# Patient Record
Sex: Female | Born: 1945 | Race: Black or African American | Hispanic: No | Smoking: Never smoker
Health system: Southern US, Community
[De-identification: ages and names within clinical notes are randomized; demographics above are authoritative.]

## PROBLEM LIST (undated history)

## (undated) DIAGNOSIS — I10 Essential (primary) hypertension: Secondary | ICD-10-CM

---

## 2018-04-11 ENCOUNTER — Encounter (HOSPITAL_COMMUNITY): Payer: Self-pay

## 2018-04-11 ENCOUNTER — Other Ambulatory Visit: Payer: Self-pay

## 2018-04-11 ENCOUNTER — Emergency Department (HOSPITAL_COMMUNITY): Payer: Medicare (Managed Care)

## 2018-04-11 ENCOUNTER — Emergency Department (HOSPITAL_COMMUNITY)
Admission: EM | Admit: 2018-04-11 | Discharge: 2018-04-11 | Disposition: A | Discharge status: Home / Self Care | Payer: Medicare (Managed Care) | Attending: Emergency Medicine | Admitting: Emergency Medicine

## 2018-04-11 DIAGNOSIS — R079 Chest pain, unspecified: Secondary | ICD-10-CM | POA: Insufficient documentation

## 2018-04-11 DIAGNOSIS — Z5321 Procedure and treatment not carried out due to patient leaving prior to being seen by health care provider: Secondary | ICD-10-CM | POA: Insufficient documentation

## 2018-04-11 HISTORY — DX: Essential (primary) hypertension: I10

## 2018-04-11 LAB — BASIC METABOLIC PANEL
ANION GAP: 10 (ref 5–15)
BUN: 11 mg/dL (ref 6–20)
CO2: 29 mmol/L (ref 22–32)
Calcium: 10.1 mg/dL (ref 8.9–10.3)
Chloride: 102 mmol/L (ref 101–111)
Creatinine, Ser: 0.67 mg/dL (ref 0.44–1.00)
GFR calc Af Amer: >60 mL/min (ref >60)
GLUCOSE: 131 mg/dL — AB (ref 65–99)
Potassium: 3.2 mmol/L — ABNORMAL LOW (ref 3.5–5.1)
SODIUM: 141 mmol/L (ref 135–145)

## 2018-04-11 LAB — CBC
HCT: 39.5 % (ref 36.0–46.0)
HEMOGLOBIN: 12.7 g/dL (ref 12.0–15.0)
MCH: 28.4 pg (ref 26.0–34.0)
MCHC: 32.2 g/dL (ref 30.0–36.0)
MCV: 88.4 fL (ref 78.0–100.0)
Platelets: 281 10*3/uL (ref 150–400)
RBC: 4.47 MIL/uL (ref 3.87–5.11)
RDW: 13.9 % (ref 11.5–15.5)
WBC: 11.3 10*3/uL — ABNORMAL HIGH (ref 4.0–10.5)

## 2018-04-11 LAB — I-STAT TROPONIN, ED: Troponin i, poc: 0 ng/mL (ref 0.00–0.08)

## 2018-04-11 NOTE — ED Notes (Signed)
Pt's son to desk to inquire about wait times. Explained triage process and apologized for wait time.  Pt's son was very polite and stated they would wait a little longer.

## 2018-04-11 NOTE — ED Triage Notes (Signed)
Pt states she is having central CP that started today denies NV, denies radiation, denies back pain. States she has felt weak.

## 2018-04-11 NOTE — ED Notes (Signed)
Pt's son to desk inquiring as to wait time.  Reassured his mother was still on the list to be seen at re iterated triage process.  Pt's son states they may elect to go to their primary physician tomorrow. Told him that while we'd like for them to wait that is their decision.

## 2018-04-11 NOTE — ED Notes (Signed)
Pt and family have made the decision to LWBS. Pt strongly encouraged to follow up with PCP in morning.

## 2018-04-12 NOTE — ED Notes (Signed)
Follow up call made  Pain is gone  Going to follow up w pcp today   0910  04/12/18 s Javiana Anwar rn

## 2018-11-12 IMAGING — CR DG CHEST 2V
2 series · 2 of 2 positions shown · non-contrast
Comparison: None.

CLINICAL DATA: 72-year-old female with central chest pain onset
today. Weakness.

EXAM:
CHEST - 2 VIEW

[chest pa]
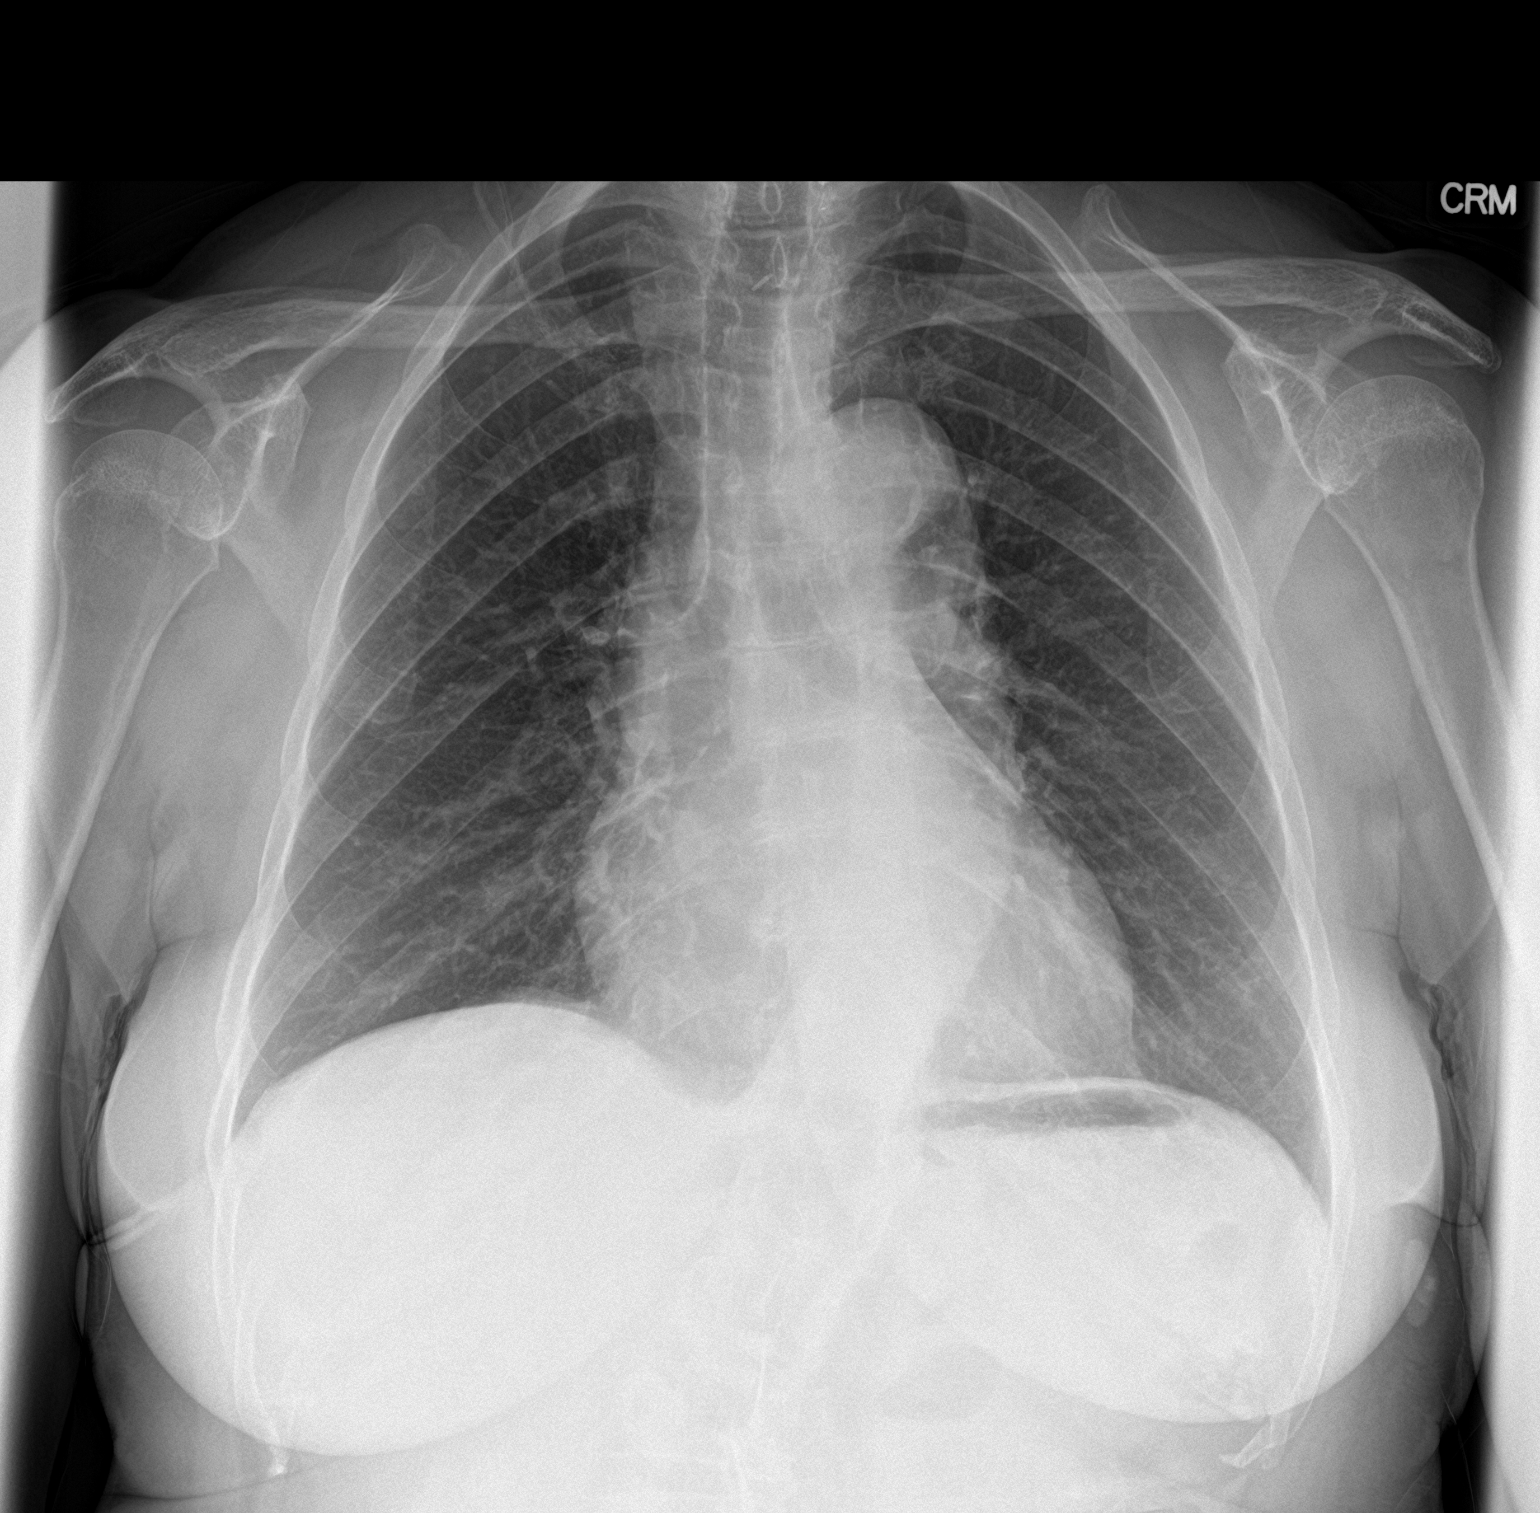

[chest lat]
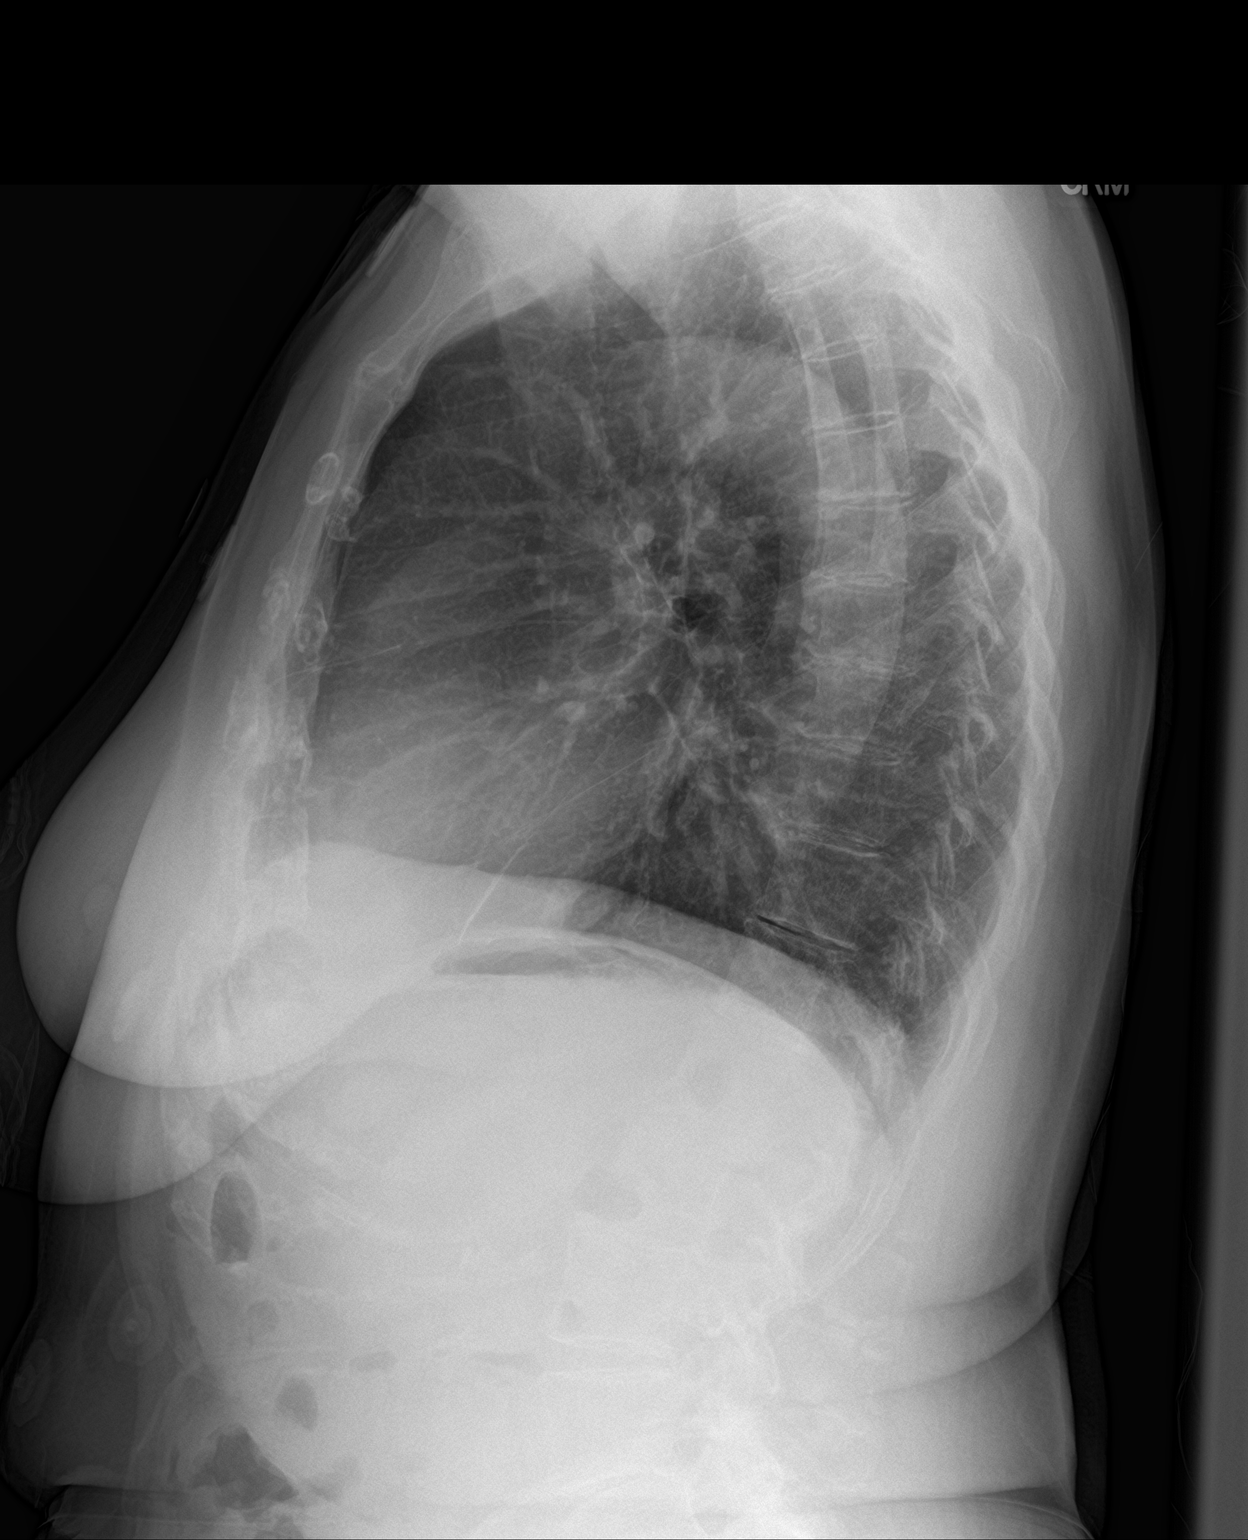

[2 of 2 positions shown; findings below may reference images not displayed]

FINDINGS: Mildly tortuous thoracic aorta. Other mediastinal contours are
normal. Visualized tracheal air column is within normal limits.
Small thoracic inlet surgical clips might indicate prior
thyroidectomy. No pneumothorax, pulmonary edema, pleural effusion or
confluent pulmonary opacity. Thoracolumbar scoliosis. No acute
osseous abnormality identified. Negative visible bowel gas pattern.
IMPRESSION: No acute cardiopulmonary abnormality.
